# Patient Record
Sex: Male | Born: 1985 | Race: Black or African American | Hispanic: No | Marital: Single | Smoking: Never smoker
Health system: Southern US, Community
[De-identification: ages and names within clinical notes are randomized; demographics above are authoritative.]

---

## 2010-11-14 ENCOUNTER — Observation Stay (HOSPITAL_COMMUNITY)
Admission: EM | Admit: 2010-11-14 | Discharge: 2010-11-14 | Disposition: A | Discharge status: Home / Self Care | Payer: Self-pay | Attending: Occupational Medicine | Admitting: Occupational Medicine

## 2010-11-14 ENCOUNTER — Emergency Department (HOSPITAL_COMMUNITY): Payer: Self-pay

## 2010-11-14 DIAGNOSIS — L02419 Cutaneous abscess of limb, unspecified: Principal | ICD-10-CM | POA: Insufficient documentation

## 2010-11-14 DIAGNOSIS — L03119 Cellulitis of unspecified part of limb: Secondary | ICD-10-CM | POA: Insufficient documentation

## 2010-11-14 LAB — BASIC METABOLIC PANEL
Calcium: 8.9 mg/dL (ref 8.4–10.5)
GFR calc Af Amer: >60 mL/min (ref >60)
GFR calc non Af Amer: >60 mL/min (ref >60)
Sodium: 141 mEq/L (ref 135–145)

## 2010-11-14 LAB — DIFFERENTIAL
Basophils Absolute: 0 10*3/uL (ref 0.0–0.1)
Basophils Relative: 0 % (ref 0–1)
Eosinophils Absolute: 0.1 10*3/uL (ref 0.0–0.7)
Monocytes Absolute: 0.9 10*3/uL (ref 0.1–1.0)
Monocytes Relative: 9 % (ref 3–12)

## 2010-11-14 LAB — CBC
MCH: 30 pg (ref 26.0–34.0)
MCHC: 35 g/dL (ref 30.0–36.0)
Platelets: 229 10*3/uL (ref 150–400)
RDW: 13.7 % (ref 11.5–15.5)

## 2011-01-22 ENCOUNTER — Inpatient Hospital Stay (INDEPENDENT_AMBULATORY_CARE_PROVIDER_SITE_OTHER)
Admission: RE | Admit: 2011-01-22 | Discharge: 2011-01-22 | Disposition: A | Discharge status: Home / Self Care | Payer: Self-pay | Source: Ambulatory Visit | Attending: Emergency Medicine | Admitting: Emergency Medicine

## 2011-01-22 DIAGNOSIS — R369 Urethral discharge, unspecified: Secondary | ICD-10-CM

## 2011-01-23 LAB — GC/CHLAMYDIA PROBE AMP, GENITAL
Chlamydia, DNA Probe: NEGATIVE
GC Probe Amp, Genital: NEGATIVE

## 2013-09-25 ENCOUNTER — Emergency Department (HOSPITAL_COMMUNITY): Payer: Self-pay

## 2013-09-25 ENCOUNTER — Emergency Department (HOSPITAL_COMMUNITY)
Admission: EM | Admit: 2013-09-25 | Discharge: 2013-09-25 | Disposition: A | Discharge status: Home / Self Care | Payer: Self-pay | Attending: Emergency Medicine | Admitting: Emergency Medicine

## 2013-09-25 ENCOUNTER — Encounter (HOSPITAL_COMMUNITY): Payer: Self-pay | Admitting: Emergency Medicine

## 2013-09-25 DIAGNOSIS — S20211A Contusion of right front wall of thorax, initial encounter: Secondary | ICD-10-CM

## 2013-09-25 DIAGNOSIS — Y9239 Other specified sports and athletic area as the place of occurrence of the external cause: Secondary | ICD-10-CM | POA: Insufficient documentation

## 2013-09-25 DIAGNOSIS — Y9367 Activity, basketball: Secondary | ICD-10-CM | POA: Insufficient documentation

## 2013-09-25 DIAGNOSIS — W1801XA Striking against sports equipment with subsequent fall, initial encounter: Secondary | ICD-10-CM | POA: Insufficient documentation

## 2013-09-25 DIAGNOSIS — S20219A Contusion of unspecified front wall of thorax, initial encounter: Secondary | ICD-10-CM | POA: Insufficient documentation

## 2013-09-25 DIAGNOSIS — Y92838 Other recreation area as the place of occurrence of the external cause: Secondary | ICD-10-CM

## 2013-09-25 MED ORDER — HYDROCODONE-ACETAMINOPHEN 5-325 MG PO TABS: 1.0000 | ORAL_TABLET | Freq: Four times a day (QID) | ORAL | Status: DC | PRN

## 2013-09-25 MED ORDER — HYDROCODONE-ACETAMINOPHEN 5-325 MG PO TABS
1.0000 | ORAL_TABLET | Freq: Once | ORAL | Status: DC
  Filled 2013-09-25: qty 1

## 2013-09-25 MED ORDER — IBUPROFEN 800 MG PO TABS: 800.0000 mg | ORAL_TABLET | Freq: Three times a day (TID) | ORAL | Status: DC

## 2013-09-25 NOTE — Discharge Instructions (Signed)
Please follow up with your primary care physician in 1-2 days. If you do not have one please call the [facility] number listed above. Please take pain medication and/or muscle relaxants as prescribed and as needed for pain. Please do not drive on narcotic pain medication or on muscle relaxants. Please read all discharge instructions and return precautions.    Chest Contusion A chest contusion is a deep bruise on your chest area. Contusions are the result of an injury that caused bleeding under the skin. A chest contusion may involve bruising of the skin, muscles, or ribs. The contusion may turn blue, purple, or yellow. Minor injuries will give you a painless contusion, but more severe contusions may stay painful and swollen for a few weeks. CAUSES  A contusion is usually caused by a blow, trauma, or direct force to an area of the body. SYMPTOMS   Swelling and redness of the injured area.  Discoloration of the injured area.  Tenderness and soreness of the injured area.  Pain. DIAGNOSIS  The diagnosis can be made by taking a history and performing a physical exam. An X-ray, CT scan, or MRI may be needed to determine if there were any associated injuries, such as broken bones (fractures) or internal injuries. TREATMENT  Often, the best treatment for a chest contusion is resting, icing, and applying cold compresses to the injured area. Deep breathing exercises may be recommended to reduce the risk of pneumonia. Over-the-counter medicines may also be recommended for pain control. HOME CARE INSTRUCTIONS   Put ice on the injured area.  Put ice in a plastic bag.  Place a towel between your skin and the bag.  Leave the ice on for 15-20 minutes, 03-04 times a day.  Only take over-the-counter or prescription medicines as directed by your caregiver. Your caregiver may recommend avoiding anti-inflammatory medicines (aspirin, ibuprofen, and naproxen) for 48 hours because these  medicines may increase bruising.  Rest the injured area.  Perform deep-breathing exercises as directed by your caregiver.  Stop smoking if you smoke.  Do not lift objects over 5 pounds (2.3 kg) for 3 days or longer if recommended by your caregiver. SEEK IMMEDIATE MEDICAL CARE IF:   You have increased bruising or swelling.  You have pain that is getting worse.  You have difficulty breathing.  You have dizziness, weakness, or fainting.  You have blood in your urine or stool.  You cough up or vomit blood.  Your swelling or pain is not relieved with medicines. MAKE SURE YOU:   Understand these instructions.  Will watch your condition.  Will get help right away if you are not doing well or get worse. Document Released: 01/07/2001 Document Revised: 01/07/2012 Document Reviewed: 10/06/2011 [facility] Patient Information 2014 [place], [place].

## 2013-09-25 NOTE — ED Provider Notes (Signed)
CSN: [id number]     Arrival date & time 09/25/13  1502 History  This chart was scribed for non-physician practitioner Francee PiccoloJennifer Enjoli Tidd working with Dagmar HaitWilliam Blair Walden, MD by Elveria Risingimelie Horne, ED Scribe. This patient was seen in room TR06C/TR06C and the patient's care was started at 3:43 PM.   Chief Complaint  Patient presents with  . Rib Injury      The history is provided by the patient. No language interpreter was used.   HPI Comments: Nathan Leblanc is a 28 y.o. male who presents to the Emergency Department with lower right rib injury that occurred while playing basketball one week ago. Patient reports that he was dunking the ball and and slamming into another player while in the air; the both hit the ground. Patient denies LOC or head injury due to his fall. Treatment with ice, no pain medication. Patient states that he is now able to apply pressure to the ribs. He denies difficulty breathing, SOB, neck pain nausea, vomiting, or abdominal pain.    History reviewed. No pertinent past medical history. History reviewed. No pertinent past surgical history. No family history on file. History  Substance Use Topics  . Smoking status: Never Smoker   . Smokeless tobacco: Not on file  . Alcohol Use: No    Review of Systems  Constitutional: Negative for fever.  Respiratory: Negative for shortness of breath.        Right lower rib pain  Cardiovascular: Negative for chest pain.  Gastrointestinal: Negative for nausea, vomiting and abdominal pain.  Musculoskeletal: Negative for back pain and neck pain.  All other systems reviewed and are negative.     Allergies  Review of patient's allergies indicates no known allergies.  Home Medications   Prior to Admission medications   Not on File   Triage Vitals: BP 131/61  Pulse 56  Temp(Src) 99.3 F (37.4 C) (Oral)  Resp 16  SpO2 99% Physical Exam  Nursing note and vitals reviewed. Constitutional: He is oriented to person, place,  and time. He appears well-developed and well-nourished. No distress.  HENT:  Head: Normocephalic and atraumatic.  Right Ear: External ear normal.  Left Ear: External ear normal.  Nose: Nose normal.  Mouth/Throat: Oropharynx is clear and moist.  Eyes: Conjunctivae are normal.  Neck: Normal range of motion. Neck supple.  Cardiovascular: Normal rate, regular rhythm and normal heart sounds.   Pulmonary/Chest: Effort normal and breath sounds normal. No respiratory distress. He exhibits tenderness. He exhibits no crepitus, no deformity and no retraction.    No flail chest  Abdominal: Soft. Normal appearance. There is no tenderness. There is no rigidity, no rebound and no guarding.  Musculoskeletal: Normal range of motion.  Neurological: He is alert and oriented to person, place, and time.  Skin: Skin is warm and dry. He is not diaphoretic.  Psychiatric: He has a normal mood and affect.    ED Course  Procedures (including critical care time) Medications  HYDROcodone-acetaminophen (NORCO/VICODIN) 5-325 MG per tablet 1 tablet (1 tablet Oral Not Given 09/25/13 1619)    DIAGNOSTIC STUDIES: Oxygen Saturation is 99% on room air, normal by my interpretation.    COORDINATION OF CARE: 3:43 PM- Discussed treatment plan with patient at bedside and patient agreed to plan.    Labs Review Labs Reviewed - No data to display  Imaging Review No results found.   EKG Interpretation None      MDM   Final diagnoses:  Contusion of rib on right side  Filed Vitals:   09/25/13 1510  BP: 131/61  Pulse: 56  Temp: 99.3 F (37.4 C)  Resp: 16   Afebrile, NAD, non-toxic appearing, AAOx4.   Lungs clear to auscultation bilaterally. No respiratory distress. No flail chest. No obvious rib deformity noted. X-ray negative for any fractures. Pain was related to rib contusion. Symptoms and should emergency department. Precautions discussed. Patient report plan. Patient stable to discharge.   I  personally performed the services described in this documentation, which was scribed in my presence. The recorded information has been reviewed and is accurate.     Jeannetta Ellis, PA-C 09/25/13 1655

## 2013-09-25 NOTE — ED Notes (Signed)
PT refused pain meds. 

## 2013-09-25 NOTE — ED Notes (Signed)
Pt st's he was playing basketball and was knocked to the ground 1 week ago.  Pt c/o pain in right rib cage.  No resp. Distress present.

## 2013-09-27 ENCOUNTER — Emergency Department (HOSPITAL_COMMUNITY)
Admission: EM | Admit: 2013-09-27 | Discharge: 2013-09-27 | Disposition: A | Discharge status: Home / Self Care | Payer: Self-pay | Attending: Emergency Medicine | Admitting: Emergency Medicine

## 2013-09-27 ENCOUNTER — Encounter (HOSPITAL_COMMUNITY): Payer: Self-pay | Admitting: Emergency Medicine

## 2013-09-27 DIAGNOSIS — Z791 Long term (current) use of non-steroidal anti-inflammatories (NSAID): Secondary | ICD-10-CM | POA: Insufficient documentation

## 2013-09-27 DIAGNOSIS — A64 Unspecified sexually transmitted disease: Secondary | ICD-10-CM | POA: Insufficient documentation

## 2013-09-27 LAB — URINALYSIS, ROUTINE W REFLEX MICROSCOPIC
Bilirubin Urine: NEGATIVE
Glucose, UA: NEGATIVE mg/dL
Hgb urine dipstick: NEGATIVE
KETONES UR: NEGATIVE mg/dL
NITRITE: NEGATIVE
PH: 6 (ref 5.0–8.0)
Protein, ur: NEGATIVE mg/dL
SPECIFIC GRAVITY, URINE: 1.028 (ref 1.005–1.030)
UROBILINOGEN UA: 1 mg/dL (ref 0.0–1.0)

## 2013-09-27 LAB — URINE MICROSCOPIC-ADD ON

## 2013-09-27 MED ORDER — CEFTRIAXONE SODIUM 1 G IJ SOLR
1.0000 g | Freq: Once | INTRAMUSCULAR | Status: AC
  Administered 2013-09-27: 1 g via INTRAMUSCULAR
  Filled 2013-09-27: qty 10

## 2013-09-27 MED ORDER — DOXYCYCLINE HYCLATE 100 MG PO CAPS: 100.0000 mg | ORAL_CAPSULE | Freq: Two times a day (BID) | ORAL | Status: AC

## 2013-09-27 MED ORDER — LIDOCAINE HCL 1 % IJ SOLN
INTRAMUSCULAR | Status: AC
  Filled 2013-09-27: qty 20

## 2013-09-27 MED ORDER — STERILE WATER FOR INJECTION IJ SOLN
INTRAMUSCULAR | Status: AC
  Filled 2013-09-27: qty 10

## 2013-09-27 MED ORDER — AZITHROMYCIN 250 MG PO TABS
1000.0000 mg | ORAL_TABLET | Freq: Once | ORAL | Status: AC
  Administered 2013-09-27: 1000 mg via ORAL
  Filled 2013-09-27: qty 4

## 2013-09-27 NOTE — ED Notes (Signed)
Pt reports noticing whitish penile dc Friday when he woke up, now discharge is clear.  Pt also reports dysuria.

## 2013-09-27 NOTE — Discharge Instructions (Signed)
Sexually Transmitted Disease A sexually transmitted disease (STD) is a disease or infection that may be passed (transmitted) from person to person, usually during sexual activity. This may happen by way of saliva, semen, blood, vaginal mucus, or urine. Common STDs include:   Gonorrhea.   Chlamydia.   Syphilis.   HIV and AIDS.   Genital herpes.   Hepatitis B and C.   Trichomonas.   Human papillomavirus (HPV).   Pubic lice.   Scabies.  Mites.  Bacterial vaginosis. WHAT ARE CAUSES OF STDs? An STD may be caused by bacteria, a virus, or parasites. STDs are often transmitted during sexual activity if one person is infected. However, they may also be transmitted through nonsexual means. STDs may be transmitted after:   Sexual intercourse with an infected person.   Sharing sex toys with an infected person.   Sharing needles with an infected person or using unclean piercing or tattoo needles.  Having intimate contact with the genitals, mouth, or rectal areas of an infected person.   Exposure to infected fluids during birth. WHAT ARE THE SIGNS AND SYMPTOMS OF STDs? Different STDs have different symptoms. Some people may not have any symptoms. If symptoms are present, they may include:   Painful or bloody urination.   Pain in the pelvis, abdomen, vagina, anus, throat, or eyes.   Skin rash, itching, irritation, growths, sores (lesions), ulcerations, or warts in the genital or anal area.  Abnormal vaginal discharge with or without bad odor.   Penile discharge in men.   Fever.   Pain or bleeding during sexual intercourse.   Swollen glands in the groin area.   Yellow skin and eyes (jaundice). This is seen with hepatitis.   Swollen testicles.  Infertility.  Sores and blisters in the mouth. HOW ARE STDs DIAGNOSED? To make a diagnosis, your health care provider may:   Take a medical history.   Perform a physical exam.   Take a sample of any  discharge for examination.  Swab the throat, cervix, opening to the penis, rectum, or vagina for testing.  Test a sample of your first morning urine.   Perform blood tests.   Perform a Pap smear, if this applies.   Perform a colposcopy.   Perform a laparoscopy.  HOW ARE STDs TREATED? Treatment depends on the STD. Some STDs may be treated but not cured.   Chlamydia, gonorrhea, trichomonas, and syphilis can be cured with antibiotics.   Genital herpes, hepatitis, and HIV can be treated, but not cured, with prescribed medicines. The medicines lessen symptoms.   Genital warts from HPV can be treated with medicine or by freezing, burning (electrocautery), or surgery. Warts may come back.   HPV cannot be cured with medicine or surgery. However, abnormal areas may be removed from the cervix, vagina, or vulva.   If your diagnosis is confirmed, your recent sexual partners need treatment. This is true even if they are symptom-free or have a negative culture or evaluation. They should not have sex until their health care providers say it is OK. HOW CAN I REDUCE MY RISK OF GETTING AN STD?  Use latex condoms, dental dams, and water-soluble lubricants during sexual activity. Do not use petroleum jelly or oils.  Get vaccinated for HPV and hepatitis. If you have not received these vaccines in the past, talk to your health care provider about whether one or both might be right for you.   Avoid risky sex practices that can break the skin.  WHAT SHOULD   I DO IF I THINK I HAVE AN STD?  See your health care provider.   Inform all sexual partners. They should be tested and treated for any STDs.  Do not have sex until your health care provider says it is OK. WHEN SHOULD I GET HELP? Seek immediate medical care if:  You develop severe abdominal pain.  You are a man and notice swelling or pain in the testicles.  You are a woman and notice swelling or pain in your vagina. Document  Released: 07/05/2002 Document Revised: 02/02/2013 Document Reviewed: 11/02/2012 [employer] Patient Information 2014 [employer].  

## 2013-09-27 NOTE — ED Provider Notes (Signed)
Medical screening examination/treatment/procedure(s) were performed by non-physician practitioner and as supervising physician I was immediately available for consultation/collaboration.   EKG Interpretation None        Khamiya Varin, MD 09/27/13 2333 

## 2013-09-27 NOTE — ED Provider Notes (Signed)
CSN: [id number]     Arrival date & time 09/27/13  1731 History  This chart was scribed for non-physician practitioner, Roxy Horsemanobert Shaynna Husby, PA-C working with Rolan BuccoMelanie Belfi, MD by Greggory StallionKayla Andersen, ED scribe. This patient was seen in room WTR9/WTR9 and the patient's care was started at 6:48 PM.   Chief Complaint  Patient presents with  . Penile Discharge   The history is provided by the patient. No language interpreter was used.   HPI Comments: Nathan Leblanc is a 28 y.o. male who presents to the Emergency Department complaining of white penile discharge and dysuria that started 4 days ago. States the discharge is now clear. Denies new sexual contacts. Denies fever, chills.   History reviewed. No pertinent past medical history. History reviewed. No pertinent past surgical history. No family history on file. History  Substance Use Topics  . Smoking status: Never Smoker   . Smokeless tobacco: Not on file  . Alcohol Use: No    Review of Systems  Constitutional: Negative for fever and chills.  HENT: Negative for congestion.   Eyes: Negative for redness.  Respiratory: Negative for shortness of breath.   Cardiovascular: Negative for chest pain.  Gastrointestinal: Negative for abdominal distention.  Genitourinary: Positive for dysuria and discharge.  Musculoskeletal: Negative for gait problem.  Skin: Negative for rash.  Neurological: Negative for speech difficulty.  Psychiatric/Behavioral: Negative for confusion.   Allergies  Review of patient's allergies indicates no known allergies.  Home Medications   Prior to Admission medications   Medication Sig Start Date End Date Taking? Authorizing Provider  HYDROcodone-acetaminophen (NORCO/VICODIN) 5-325 MG per tablet Take 1-2 tablets by mouth every 6 (six) hours as needed. 09/25/13   Jennifer L Piepenbrink, PA-C  ibuprofen (ADVIL,MOTRIN) 800 MG tablet Take 1 tablet (800 mg total) by mouth 3 (three) times daily. 09/25/13   Jennifer L Piepenbrink,  PA-C   BP 114/78  Pulse 56  Temp(Src) 98.9 F (37.2 C) (Oral)  Resp 15  SpO2 100%  Physical Exam  Nursing note and vitals reviewed. Constitutional: He is oriented to person, place, and time. He appears well-developed. No distress.  HENT:  Head: Normocephalic and atraumatic.  Eyes: Conjunctivae and EOM are normal.  Cardiovascular: Normal rate and regular rhythm.   Pulmonary/Chest: Effort normal. No stridor. No respiratory distress.  Abdominal: He exhibits no distension.  Musculoskeletal: He exhibits no edema.  Neurological: He is alert and oriented to person, place, and time.  Skin: Skin is warm and dry.  Psychiatric: He has a normal mood and affect.    ED Course  Procedures (including critical care time)  DIAGNOSTIC STUDIES: Oxygen Saturation is 100% on RA, normal by my interpretation.    COORDINATION OF CARE: 6:48 PM-Discussed treatment plan which includes STD testing with pt at bedside and pt agreed to plan.   Labs Review Labs Reviewed  URINALYSIS, ROUTINE W REFLEX MICROSCOPIC - Abnormal; Notable for the following:    Leukocytes, UA SMALL (*)    All other components within normal limits  GC/CHLAMYDIA PROBE AMP  URINE MICROSCOPIC-ADD ON  HIV ANTIBODY (ROUTINE TESTING)  RPR    Imaging Review No results found.   EKG Interpretation None      MDM   Final diagnoses:  STD (male)    Patient was suspected SCD. Treat with azithromycin, ceftriaxone, doxycycline. Discharge to home. Syphilis RPR, G/C and HIV results are pending.  I personally performed the services described in this documentation, which was scribed in my presence. The recorded information has been  reviewed and is accurate.  Roxy Horseman, PA-C 09/27/13 [phone]

## 2013-09-27 NOTE — ED Provider Notes (Signed)
Medical screening examination/treatment/procedure(s) were performed by non-physician practitioner and as supervising physician I was immediately available for consultation/collaboration.   EKG Interpretation None        William Jalasia Eskridge, MD 09/27/13 0724 

## 2013-09-28 LAB — GC/CHLAMYDIA PROBE AMP
CT PROBE, AMP APTIMA: NEGATIVE
GC PROBE AMP APTIMA: NEGATIVE

## 2013-09-28 LAB — RPR

## 2013-09-28 LAB — HIV ANTIBODY (ROUTINE TESTING W REFLEX): HIV 1&2 Ab, 4th Generation: NONREACTIVE

## 2016-01-17 IMAGING — CR DG RIBS W/ CHEST 3+V*R*
5 series · 5 of 5 positions shown · non-contrast
Comparison: None.

CLINICAL DATA: Right anterior rib pain for 8 days.

EXAM:
RIGHT RIBS AND CHEST - 3+ VIEW

[w chest pa]
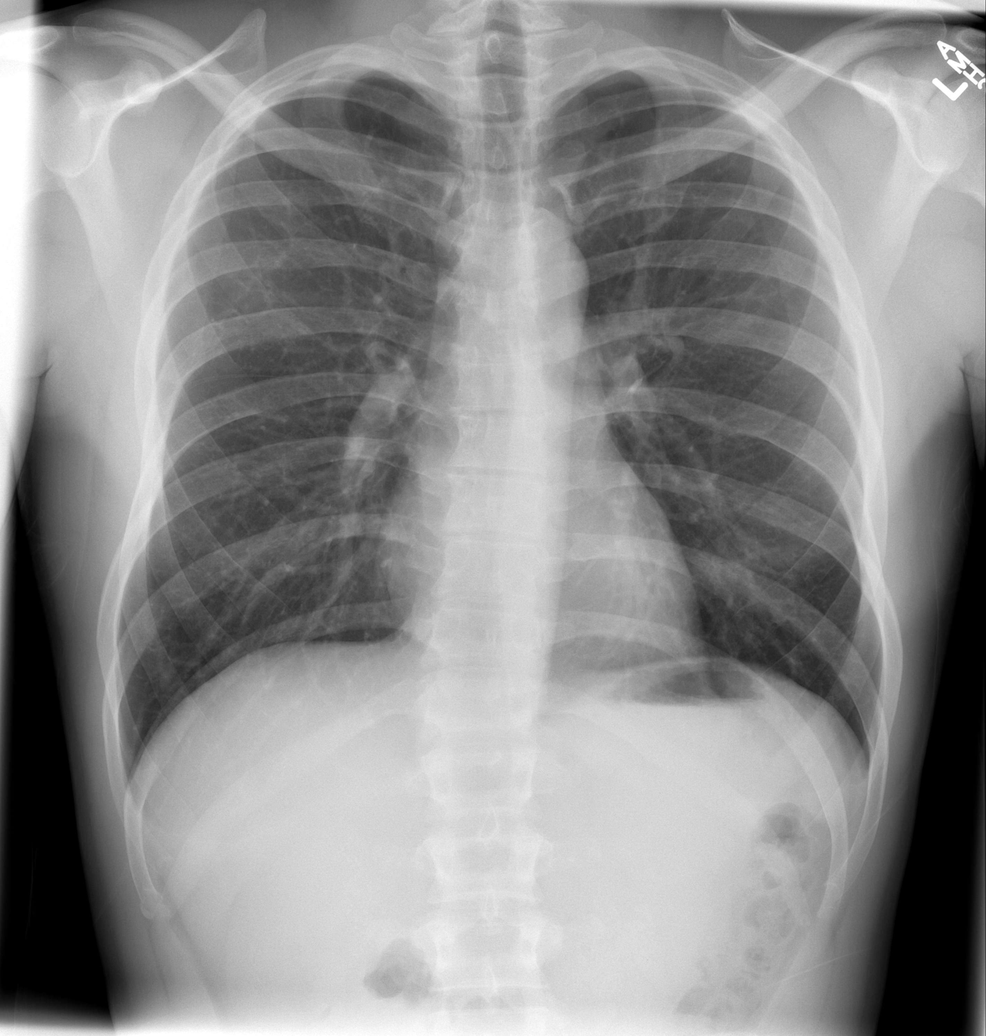

[w ribs ap/pa upper right]
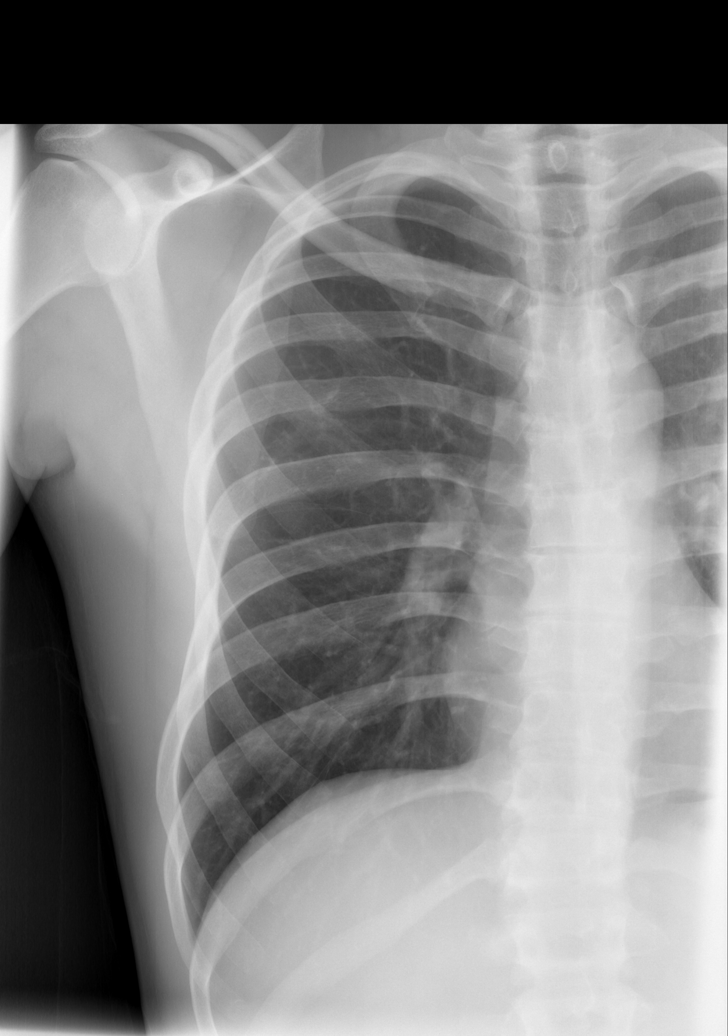

[w ribs ap/pa lower right]
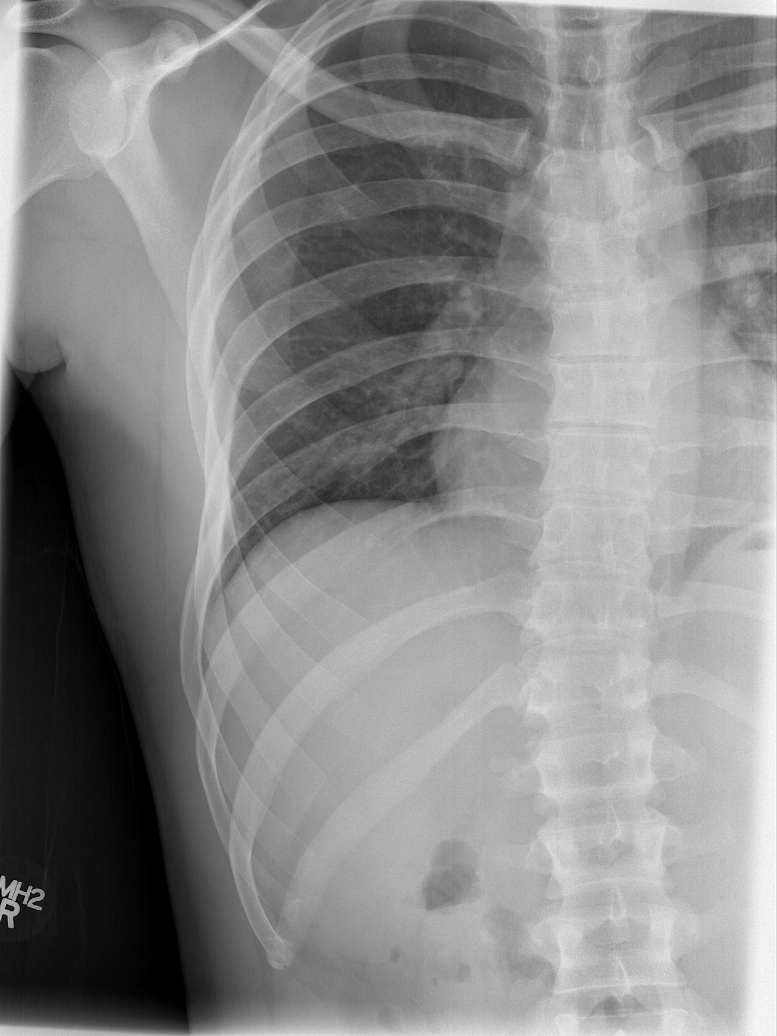

[w ribs oblique right (1 of 2)]
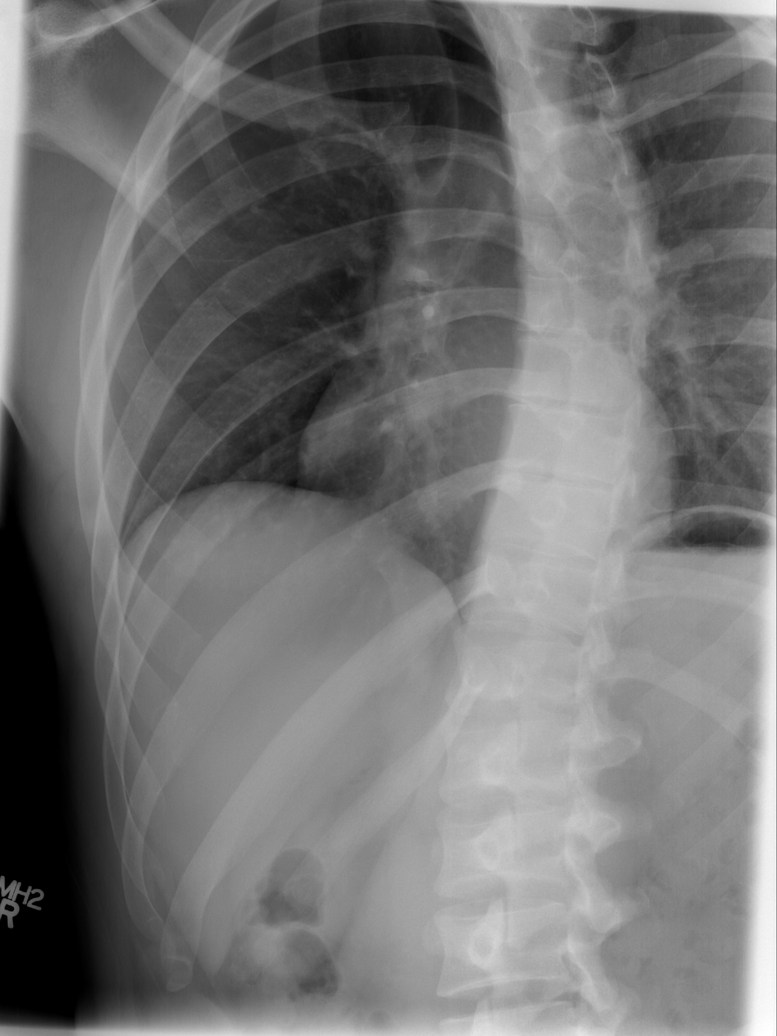

[w ribs oblique right (2 of 2)]
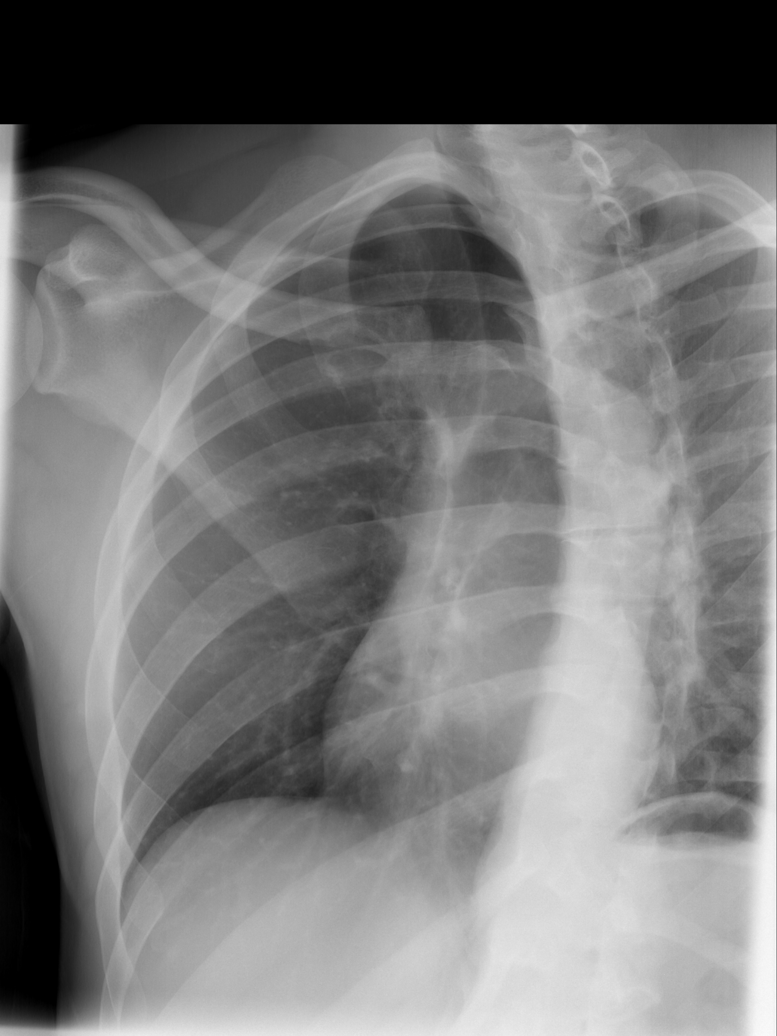

[5 of 5 positions shown; findings below may reference images not displayed]

FINDINGS: No fracture or other bone lesions are seen involving the ribs. There
is no evidence of pneumothorax or pleural effusion. Both lungs are
clear. Heart size and mediastinal contours are within normal limits.
IMPRESSION: Negative.

## 2016-04-21 ENCOUNTER — Emergency Department (HOSPITAL_COMMUNITY)
Admission: EM | Admit: 2016-04-21 | Discharge: 2016-04-21 | Disposition: A | Discharge status: Home / Self Care | Payer: Self-pay | Attending: Emergency Medicine | Admitting: Emergency Medicine

## 2016-04-21 ENCOUNTER — Encounter (HOSPITAL_COMMUNITY): Payer: Self-pay | Admitting: Emergency Medicine

## 2016-04-21 DIAGNOSIS — Z711 Person with feared health complaint in whom no diagnosis is made: Secondary | ICD-10-CM

## 2016-04-21 DIAGNOSIS — Z202 Contact with and (suspected) exposure to infections with a predominantly sexual mode of transmission: Secondary | ICD-10-CM | POA: Insufficient documentation

## 2016-04-21 MED ORDER — AZITHROMYCIN 250 MG PO TABS
1000.0000 mg | ORAL_TABLET | Freq: Once | ORAL | Status: AC
  Administered 2016-04-21: 1000 mg via ORAL
  Filled 2016-04-21: qty 4

## 2016-04-21 MED ORDER — CEFTRIAXONE SODIUM 250 MG IJ SOLR
250.0000 mg | Freq: Once | INTRAMUSCULAR | Status: AC
  Administered 2016-04-21: 250 mg via INTRAMUSCULAR
  Filled 2016-04-21: qty 250

## 2016-04-21 MED ORDER — LIDOCAINE HCL 1 % IJ SOLN
INTRAMUSCULAR | Status: AC
  Administered 2016-04-21: 20 mL
  Filled 2016-04-21: qty 20

## 2016-04-21 NOTE — Discharge Instructions (Signed)
In the future, you can go to the health department for STD check. You will be called if any of your results return positive, however due to the duration of exposure, testing may not be accurate at this time. You have already been treated for gonorrhea and chlamydia. Please return to emergency department if you develop any new or worsening symptoms.

## 2016-04-21 NOTE — ED Provider Notes (Signed)
[facility]-EMERGENCY DEPT Provider Note   CSN: [id number] Arrival date & time: 04/21/16  1049     History   Chief Complaint Chief Complaint  Patient presents with  . SEXUALLY TRANSMITTED DISEASE    HPI Nathan Leblanc is a 30 y.o. male who is previously healthy who presents for concern about STD exposure. Patient reports his condom broke last night and he would like treated for STDs. Patient denies any symptoms. Patient denies any fevers, chest pain, shortness of breath, abdominal pain, nausea, vomiting, urinary symptoms, penile discharge, scrotal swelling or pain.  HPI  History reviewed. No pertinent past medical history.  There are no active problems to display for this patient.   History reviewed. No pertinent surgical history.     Home Medications    Prior to Admission medications   Medication Sig Start Date End Date Taking? Authorizing Provider  doxycycline (VIBRAMYCIN) 100 MG capsule Take 1 capsule (100 mg total) by mouth 2 (two) times daily. 09/27/13   Roxy Horsemanobert Browning, PA-C    Family History History reviewed. No pertinent family history.  Social History Social History  Substance Use Topics  . Smoking status: Never Smoker  . Smokeless tobacco: Not on file  . Alcohol use No     Allergies   Patient has no known allergies.   Review of Systems Review of Systems  Constitutional: Negative for chills and fever.  HENT: Negative for facial swelling.   Respiratory: Negative for shortness of breath.   Cardiovascular: Negative for chest pain.  Gastrointestinal: Negative for abdominal pain, nausea and vomiting.  Genitourinary: Negative for discharge, dysuria, penile pain, penile swelling, scrotal swelling and testicular pain.  Skin: Negative for rash and wound.  Psychiatric/Behavioral: The patient is not nervous/anxious.      Physical Exam Updated Vital Signs BP 124/86 (BP Location: Left Arm)   Pulse 60   Temp 97.8 F (36.6 C) (Oral)   Resp 16   SpO2 99%    Physical Exam  Constitutional: He appears well-developed and well-nourished. No distress.  HENT:  Head: Normocephalic and atraumatic.  Mouth/Throat: Oropharynx is clear and moist. No oropharyngeal exudate.  Eyes: Conjunctivae are normal. Pupils are equal, round, and reactive to light. Right eye exhibits no discharge. Left eye exhibits no discharge. No scleral icterus.  Neck: Normal range of motion. Neck supple. No thyromegaly present.  Cardiovascular: Normal rate, regular rhythm, normal heart sounds and intact distal pulses.  Exam reveals no gallop and no friction rub.   No murmur heard. Pulmonary/Chest: Effort normal and breath sounds normal. No stridor. No respiratory distress. He has no wheezes. He has no rales.  Abdominal: Soft. Bowel sounds are normal. He exhibits no distension. There is no tenderness. There is no rebound and no guarding.  Genitourinary: Testes normal and penis normal. No discharge found.  Musculoskeletal: He exhibits no edema.  Lymphadenopathy:    He has no cervical adenopathy.  Neurological: He is alert. Coordination normal.  Skin: Skin is warm and dry. No rash noted. He is not diaphoretic. No pallor.  Psychiatric: He has a normal mood and affect.  Nursing note and vitals reviewed.    ED Treatments / Results  Labs (all labs ordered are listed, but only abnormal results are displayed) Labs Reviewed  GC/CHLAM[facility]DIA PROBE AMP [facility]    EKG  EKG Interpretation None       Radiology No results found.  Procedures Procedures (including critical care time)  Medications Ordered in ED Medications  cefTRIAXone (ROCEPHIN) injection  250 mg (not administered)  azithromycin (ZITHROMAX) tablet 1,000 mg (not administered)     Initial Impression / Assessment and Plan / ED Course  I have reviewed the triage vital signs and the nursing notes.  Pertinent labs & imaging results that were available during my care of the patient were reviewed by  me and considered in my medical decision making (see chart for details).  Clinical Course     Pt arrives for asymptomatic STD check and treatment. Patient reports that he was tested for HIV and syphilis 2 weeks ago and declines testing now. GC/Chlamydia sent, however due to limited time since exposure, may be unreliable. Patient treated in the ED with Rocephin and azithromycin. Discussed safe sexual practices. Pt is advised to follow up for free testing at local health department in the future. Pt appears safe for discharge.    Final Clinical Impressions(s) / ED Diagnoses   Final diagnoses:  Concern about STD in male without diagnosis    New Prescriptions New Prescriptions   No medications on file     Emi Holeslexandra M Mitra Duling, PA-C 04/21/16 1222    Samuel JesterKathleen McManus, DO 04/25/16 1816

## 2016-04-21 NOTE — ED Triage Notes (Signed)
Pt reports his condom broke last night and wants to be treated for STDs. No symptoms.

## 2016-04-22 LAB — GC/CHLAMYDIA PROBE AMP (~~LOC~~) NOT AT ARMC
Chlamydia: NEGATIVE
NEISSERIA GONORRHEA: NEGATIVE

## 2022-11-11 ENCOUNTER — Emergency Department
Admission: EM | Admit: 2022-11-11 | Discharge: 2022-11-11 | Disposition: A | Discharge status: Home / Self Care | Payer: Medicaid Other | Attending: Emergency Medicine | Admitting: Emergency Medicine

## 2022-11-11 ENCOUNTER — Other Ambulatory Visit: Payer: Self-pay

## 2022-11-11 ENCOUNTER — Encounter: Payer: Self-pay | Admitting: Emergency Medicine

## 2022-11-11 DIAGNOSIS — W260XXA Contact with knife, initial encounter: Secondary | ICD-10-CM | POA: Diagnosis not present

## 2022-11-11 DIAGNOSIS — S61411A Laceration without foreign body of right hand, initial encounter: Secondary | ICD-10-CM | POA: Insufficient documentation

## 2022-11-11 NOTE — ED Provider Notes (Signed)
[facility] Provider Note  Patient Contact: 3:49 PM (approximate)   History   Laceration   HPI  Nathan Leblanc is a 37 y.o. male  presents to the ED with a 0.5 cm laceration between thumb and index finger that was sustained accidentally on a piece of glass while helping a friend move. No numbness or tingling in right hand. No similar injuries in the past.       Physical Exam   Triage Vital Signs: ED Triage Vitals  Encounter Vitals Group     BP 11/11/22 1502 (!) 117/104     Systolic BP Percentile --      Diastolic BP Percentile --      Pulse Rate 11/11/22 1502 67     Resp 11/11/22 1502 18     Temp 11/11/22 1502 98.3 F (36.8 C)     Temp Source 11/11/22 1502 Oral     SpO2 11/11/22 1502 100 %     Weight 11/11/22 1503 187 lb (84.8 kg)     Height 11/11/22 1503 6\' 2"  (1.88 m)     Head Circumference --      Peak Flow --      Pain Score 11/11/22 1503 0     Pain Loc --      Pain Education --      Exclude from Growth Chart --     Most recent vital signs: Vitals:   11/11/22 1502  BP: (!) 117/104  Pulse: 67  Resp: 18  Temp: 98.3 F (36.8 C)  SpO2: 100%     General: Alert and in no acute distress. Eyes:  PERRL. EOMI. Head: No acute traumatic finding ENT:      Nose: No congestion/rhinnorhea.      Mouth/Throat: Mucous membranes are moist. Neck: No stridor. No cervical spine tenderness to palpation. Cardiovascular:  Good peripheral perfusion Respiratory: Normal respiratory effort without tachypnea or retractions. Lungs CTAB. Good air entry to the bases with no decreased or absent breath sounds. Gastrointestinal: Bowel sounds 4 quadrants. Soft and nontender to palpation. No guarding or rigidity. No palpable masses. No distention. No CVA tenderness. Musculoskeletal: Full range of motion to all extremities.  Neurologic:  No gross focal neurologic deficits are appreciated.  Skin: Patient has superficial appearing laceration between right index  finger and thumb.    ED Results / Procedures / Treatments   Labs (all labs ordered are listed, but only abnormal results are displayed) Labs Reviewed - No data to display        PROCEDURES:  Critical Care performed: No  ..Laceration Repair  Date/Time: 11/11/2022 5:30 PM  Performed by: Orvil Feil, PA-C Authorized by: Orvil Feil, PA-C   Consent:    Consent obtained:  Verbal   Risks discussed:  Infection and pain Universal protocol:    Procedure explained and questions answered to patient or proxy's satisfaction: yes     Patient identity confirmed:  Verbally with patient Anesthesia:    Anesthesia method:  None Laceration details:    Location:  Hand   Hand location:  R hand, dorsum   Length (cm):  0.5   Depth (mm):  1 Pre-procedure details:    Preparation:  Patient was prepped and draped in usual sterile fashion Exploration:    Limited defect created (wound extended): no     Contaminated: no   Treatment:    Area cleansed with:  Povidone-iodine   Amount of cleaning:  Standard   Visualized foreign bodies/material removed:  no     Debridement:  None Skin repair:    Repair method:  Tissue adhesive Approximation:    Approximation:  Close Repair type:    Repair type:  Simple Post-procedure details:    Dressing:  Open (no dressing)    MEDICATIONS ORDERED IN ED: Medications - No data to display   IMPRESSION / MDM / ASSESSMENT AND PLAN / ED COURSE  I reviewed the triage vital signs and the nursing notes.                              Assessment and plan Hand laceration 37 year old male presents to the emergency department with a superficial appearing right hand laceration that was repaired in the emergency department with Dermabond.  Tetanus status is up-to-date.  Return precautions were given to return with new or worsening symptoms.     FINAL CLINICAL IMPRESSION(S) / ED DIAGNOSES   Final diagnoses:  Laceration of right hand without foreign  body, initial encounter     Rx / DC Orders   ED Discharge Orders     None        Note:  This document was prepared using Dragon voice recognition software and may include unintentional dictation errors.   Pia Mau [place], PA-C 11/11/22 1731    Jene Every, MD 11/15/22 1139

## 2022-11-11 NOTE — ED Triage Notes (Signed)
Pt here with a right hand laceration. Pt cut his had on glass. Pt has a small laceration in between his pointer finger and thumb. Pt ambulatory to triage.

## 2023-04-06 ENCOUNTER — Emergency Department
Admission: EM | Admit: 2023-04-06 | Discharge: 2023-04-06 | Disposition: A | Discharge status: Home / Self Care | Payer: Medicaid Other | Attending: Emergency Medicine | Admitting: Emergency Medicine

## 2023-04-06 ENCOUNTER — Encounter: Payer: Self-pay | Admitting: Emergency Medicine

## 2023-04-06 ENCOUNTER — Other Ambulatory Visit: Payer: Self-pay

## 2023-04-06 DIAGNOSIS — X58XXXA Exposure to other specified factors, initial encounter: Secondary | ICD-10-CM | POA: Diagnosis not present

## 2023-04-06 DIAGNOSIS — M545 Low back pain, unspecified: Secondary | ICD-10-CM | POA: Diagnosis present

## 2023-04-06 DIAGNOSIS — S39012A Strain of muscle, fascia and tendon of lower back, initial encounter: Secondary | ICD-10-CM | POA: Insufficient documentation

## 2023-04-06 MED ORDER — METHOCARBAMOL 500 MG PO TABS: 500.0000 mg | ORAL_TABLET | Freq: Three times a day (TID) | ORAL | 1 refills | Status: AC | PRN

## 2023-04-06 MED ORDER — NAPROXEN 500 MG PO TABS: 500.0000 mg | ORAL_TABLET | Freq: Two times a day (BID) | ORAL | 2 refills | Status: AC

## 2023-04-06 MED ORDER — KETOROLAC TROMETHAMINE 30 MG/ML IJ SOLN
30.0000 mg | Freq: Once | INTRAMUSCULAR | Status: AC
  Administered 2023-04-06: 30 mg via INTRAMUSCULAR
  Filled 2023-04-06: qty 1

## 2023-04-06 NOTE — ED Provider Notes (Signed)
   [facility] Provider Note    Event Date/Time   First MD Initiated Contact with Patient 04/06/23 0818     (approximate)   History   Back Pain   HPI  Nathan Leblanc is a 37 y.o. male who presents with complaints of low back pain, the started yesterday, he reports developed after bending over.  No neurodeficits, no lower extremity weakness or incontinence.  No fevers or chills.  No IV drug abuse.  Aching pain primarily in the right lower back worse with twisting or bending     Physical Exam   Triage Vital Signs: ED Triage Vitals [04/06/23 0811]  Encounter Vitals Group     BP 126/89     Systolic BP Percentile      Diastolic BP Percentile      Pulse Rate (!) 59     Resp 18     Temp 98.6 F (37 C)     Temp Source Oral     SpO2 100 %     Weight 89.4 kg (197 lb)     Height 1.88 m (6\' 2" )     Head Circumference      Peak Flow      Pain Score 8     Pain Loc      Pain Education      Exclude from Growth Chart     Most recent vital signs: Vitals:   04/06/23 0811  BP: 126/89  Pulse: (!) 59  Resp: 18  Temp: 98.6 F (37 C)  SpO2: 100%     General: Awake, no distress.  CV:  Good peripheral perfusion.  Resp:  Normal effort.  Abd:  No distention.  Other:  Mild right paraspinal lumbar tenderness to palpation, no vertebral tenderness palpation, ambulates well and without difficulty.  No saddle anesthesia, normal strength in the lower extremities normal pulses   ED Results / Procedures / Treatments   Labs (all labs ordered are listed, but only abnormal results are displayed) Labs Reviewed - No data to display   EKG     RADIOLOGY     PROCEDURES:  Critical Care performed:   Procedures   MEDICATIONS ORDERED IN ED: Medications  ketorolac (TORADOL) 30 MG/ML injection 30 mg (30 mg Intramuscular Given 04/06/23 0843)     IMPRESSION / MDM / ASSESSMENT AND PLAN / ED COURSE  I reviewed the triage vital signs and the nursing  notes. Patient's presentation is most consistent with acute, uncomplicated illness.  Patient presents with low back pain as detailed above, most consistent with lumbar strain/spasm.  Will treat with IM Toradol here, Naprosyn, Robaxin at home, work note provided, outpatient follow-up as needed        FINAL CLINICAL IMPRESSION(S) / ED DIAGNOSES   Final diagnoses:  Strain of lumbar region, initial encounter     Rx / DC Orders   ED Discharge Orders          Ordered    naproxen (NAPROSYN) 500 MG tablet  2 times daily with meals        04/06/23 0838    methocarbamol (ROBAXIN) 500 MG tablet  Every 8 hours PRN        04/06/23 9147             Note:  This document was prepared using Dragon voice recognition software and may include unintentional dictation errors.   Jene Every, MD 04/06/23 [phone]

## 2023-04-06 NOTE — ED Triage Notes (Signed)
Patient to ED via POV for back pain. Mid to lower that started yesterday but worse today. No known injury.
